# Patient Record
Sex: Male | Born: 1955 | Race: White | Hispanic: No | Smoking: Never smoker
Health system: Southern US, Community
[De-identification: ages and names within clinical notes are randomized; demographics above are authoritative.]

## PROBLEM LIST (undated history)

## (undated) DIAGNOSIS — I10 Essential (primary) hypertension: Secondary | ICD-10-CM

## (undated) DIAGNOSIS — E079 Disorder of thyroid, unspecified: Secondary | ICD-10-CM

---

## 2018-03-18 ENCOUNTER — Emergency Department (HOSPITAL_COMMUNITY): Payer: Worker's Compensation

## 2018-03-18 ENCOUNTER — Emergency Department (HOSPITAL_COMMUNITY)
Admission: EM | Admit: 2018-03-18 | Discharge: 2018-03-19 | Disposition: A | Discharge status: Home / Self Care | Payer: Worker's Compensation | Attending: Emergency Medicine | Admitting: Emergency Medicine

## 2018-03-18 ENCOUNTER — Encounter (HOSPITAL_COMMUNITY): Payer: Self-pay

## 2018-03-18 DIAGNOSIS — Y9389 Activity, other specified: Secondary | ICD-10-CM | POA: Diagnosis not present

## 2018-03-18 DIAGNOSIS — Z79899 Other long term (current) drug therapy: Secondary | ICD-10-CM | POA: Insufficient documentation

## 2018-03-18 DIAGNOSIS — S299XXA Unspecified injury of thorax, initial encounter: Secondary | ICD-10-CM | POA: Diagnosis present

## 2018-03-18 DIAGNOSIS — M25852 Other specified joint disorders, left hip: Secondary | ICD-10-CM | POA: Insufficient documentation

## 2018-03-18 DIAGNOSIS — S2242XA Multiple fractures of ribs, left side, initial encounter for closed fracture: Secondary | ICD-10-CM | POA: Diagnosis not present

## 2018-03-18 DIAGNOSIS — R52 Pain, unspecified: Secondary | ICD-10-CM

## 2018-03-18 DIAGNOSIS — Y929 Unspecified place or not applicable: Secondary | ICD-10-CM | POA: Insufficient documentation

## 2018-03-18 DIAGNOSIS — I10 Essential (primary) hypertension: Secondary | ICD-10-CM | POA: Diagnosis not present

## 2018-03-18 DIAGNOSIS — Y999 Unspecified external cause status: Secondary | ICD-10-CM | POA: Diagnosis not present

## 2018-03-18 DIAGNOSIS — W19XXXA Unspecified fall, initial encounter: Secondary | ICD-10-CM

## 2018-03-18 HISTORY — DX: Disorder of thyroid, unspecified: E07.9

## 2018-03-18 HISTORY — DX: Essential (primary) hypertension: I10

## 2018-03-18 MED ORDER — HYDROCODONE-ACETAMINOPHEN 5-325 MG PO TABS
1.0000 | ORAL_TABLET | Freq: Once | ORAL | Status: AC
  Administered 2018-03-18: 1 via ORAL
  Filled 2018-03-18: qty 1

## 2018-03-18 MED ORDER — HYDROCODONE-ACETAMINOPHEN 5-325 MG PO TABS
1.0000 | ORAL_TABLET | ORAL | 0 refills | Status: AC | PRN
Start: 2018-03-18 — End: ?

## 2018-03-18 NOTE — Discharge Instructions (Signed)
If you develop worsening pain or if you develop shortness of breath or any other new or concerning symptoms then return to the ER for evaluation.  Otherwise follow-up with your primary care doctor.  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

## 2018-03-18 NOTE — ED Notes (Signed)
Bed: [facility] Expected date:  Expected time:  Means of arrival:  Comments: Fall 5 feet

## 2018-03-18 NOTE — ED Provider Notes (Signed)
[facility]-EMERGENCY DEPT Provider Note   CSN: [id number] Arrival date & time: 03/18/18  2034     History   Chief Complaint No chief complaint on file.   HPI Barry Chapman is a 62 y.o. male.  HPI  62 year old male presents after a fall.  He was on a flat bed truck about 5 feet above the ground and his foot got caught and he slipped and fell and landed on the concrete.  He landed on his left side.  He is having most of the pain in his left scapula.  He is also having left leg pain.  He was unable to get up on his own but when friends lifted him up he was able to bear weight on the left side.  He did not hit his head or lose consciousness.  He denies headache, neck pain, chest pain, shortness of breath or abdominal pain.  Pain is moderate.  He has not taken anything for the pain.  This occurred about 30 minutes prior to arrival.  Past Medical History:  Diagnosis Date  . Hypertension   . Thyroid disease     There are no active problems to display for this patient.   History reviewed. No pertinent surgical history.      Home Medications    Prior to Admission medications   Medication Sig Start Date End Date Taking? Authorizing Provider  escitalopram (LEXAPRO) 10 MG tablet Take 10 mg by mouth daily.   Yes [provider]  levothyroxine (SYNTHROID, LEVOTHROID) 175 MCG tablet Take 175 mcg by mouth daily before breakfast.   Yes [provider]  losartan-hydrochlorothiazide (HYZAAR) 100-12.5 MG tablet Take 1 tablet by mouth daily.   Yes [provider]  HYDROcodone-acetaminophen (NORCO) 5-325 MG tablet Take 1-2 tablets by mouth every 4 (four) hours as needed for severe pain. 03/18/18   Pricilla Loveless, MD    Family History History reviewed. No pertinent family history.  Social History Social History   Tobacco Use  . Smoking status: Never Smoker  . Smokeless tobacco: Never Used  Substance Use Topics  . Alcohol use: Never   Frequency: Never  . Drug use: Never     Allergies   Penicillins and Sulfa antibiotics   Review of Systems Review of Systems  Respiratory: Negative for shortness of breath.   Cardiovascular: Negative for chest pain.  Gastrointestinal: Negative for abdominal pain and vomiting.  Musculoskeletal: Positive for arthralgias.  Neurological: Negative for weakness, numbness and headaches.  All other systems reviewed and are negative.    Physical Exam Updated Vital Signs BP 124/79 (BP Location: Left Arm)   Pulse 63   Temp 98.6 F (37 C) (Oral)   SpO2 96%   Physical Exam  Constitutional: He is oriented to person, place, and time. He appears well-developed and well-nourished. No distress.  HENT:  Head: Normocephalic and atraumatic.  Right Ear: External ear normal.  Left Ear: External ear normal.  Nose: Nose normal.  Eyes: Right eye exhibits no discharge. Left eye exhibits no discharge.  Neck: Normal range of motion. Neck supple.  Cardiovascular: Normal rate, regular rhythm and normal heart sounds.  Pulses:      Radial pulses are 2+ on the left side.       Dorsalis pedis pulses are 2+ on the left side.  Pulmonary/Chest: Effort normal and breath sounds normal.  Abdominal: Soft. He exhibits no distension. There is no tenderness.  Musculoskeletal: He exhibits no edema.  Left shoulder: He exhibits normal range of motion.       Left hip: He exhibits tenderness (lateral). He exhibits normal range of motion.       Left knee: He exhibits normal range of motion. No tenderness found.       Cervical back: He exhibits tenderness.       Thoracic back: He exhibits bony tenderness.       Lumbar back: He exhibits no tenderness.       Back:       Left upper leg: He exhibits tenderness (lateral).       Left lower leg: He exhibits tenderness (lateral).  Neurological: He is alert and oriented to person, place, and time.  Skin: Skin is warm and dry. He is not diaphoretic.  Nursing note and  vitals reviewed.    ED Treatments / Results  Labs (all labs ordered are listed, but only abnormal results are displayed) Labs Reviewed - No data to display  EKG None  Radiology Dg Chest 1 View  Result Date: 03/18/2018 CLINICAL DATA:  Left scapular pain after fall from truck EXAM: CHEST  1 VIEW COMPARISON:  Left scapular radiographs from earlier on the same day. FINDINGS: Heart size is top-normal. No mediastinal widening. No aortic aneurysm. No pneumothorax or pulmonary consolidation. Minimally displaced left fifth and sixth rib fractures are demonstrated. The fourth rib fracture best seen on the scapular radiographs is occult on these chest radiographs. No additional osseous abnormality. IMPRESSION: Borderline cardiomegaly without active pulmonary disease. Only the left fifth and sixth rib fractures are seen on these chest radiographs. The left fourth rib fracture seen on the scapular radiographs is occult. No pneumothorax or pulmonary consolidation. Electronically Signed   By: Tollie Eth M.D.   On: 03/18/2018 22:33   Dg Thoracic Spine W/swimmers  Result Date: 03/18/2018 CLINICAL DATA:  Pain after fall from truck. EXAM: THORACIC SPINE - 3 VIEWS COMPARISON:  Left scapular radiographs from the same day. FINDINGS: There is no evidence of thoracic spine fracture. Alignment is normal. Posterior left fifth rib fracture is included on this study. The fourth and sixth rib fractures are not included. IMPRESSION: Negative for acute thoracic spine fracture. Of the known left-sided rib fracture seen on the scapular radiographs, only the fifth rib fracture is included on this study. Electronically Signed   By: Tollie Eth M.D.   On: 03/18/2018 22:30   Dg Pelvis 1-2 Views  Result Date: 03/18/2018 CLINICAL DATA:  Larey Seat off a truck.  Pain. EXAM: PELVIS - 1-2 VIEW COMPARISON:  None. FINDINGS: There is no evidence of pelvic fracture or diastasis. No pelvic bone lesions are seen. IMPRESSION: Negative.  Electronically Signed   By: Elsie Stain M.D.   On: 03/18/2018 22:22   Dg Scapula Left  Result Date: 03/18/2018 CLINICAL DATA:  Pain after fall from truck EXAM: LEFT SCAPULA - 2+ VIEWS COMPARISON:  None. FINDINGS: Acute left fourth, fifth and sixth rib fractures with age-indeterminate eighth rib fracture posteriorly. Slight displacement noted of the fifth and sixth ribs, more so of the sixth rib. No pneumothorax or effusion. The scapula appears intact. No malalignment at the [facility] nor glenohumeral joints. IMPRESSION: 1. Acute left fourth through sixth rib fractures with slight displacement of the fifth and sixth ribs. Cortical irregularity without definite fracture lucency of the posterior eighth rib may represent an age-indeterminate fracture. 2. Intact scapula. Electronically Signed   By: Tollie Eth M.D.   On: 03/18/2018 22:28   Dg Tibia/fibula Left  Result Date: 03/18/2018 CLINICAL DATA:  [place]are[place] Seat off a truck.  Pain. EXAM: LEFT TIBIA AND FIBULA - 2 VIEW COMPARISON:  None. FINDINGS: There is no evidence of fracture or other focal bone lesions. Soft tissues are unremarkable. IMPRESSION: Negative. Electronically Signed   By: Elsie Stain M.D.   On: 03/18/2018 22:23   Dg Femur Min 2 Views Left  Result Date: 03/18/2018 CLINICAL DATA:  Patient fell 5 feet from a truck at work. Patient fell onto left scapula and presents with pain as well as difficulty straightening left leg. EXAM: LEFT FEMUR 2 VIEWS COMPARISON:  None. FINDINGS: Aspherical appearance of the left femoral head compatible with femoroacetabular impingement morphology. Slight protrusio of the acetabulum. No femoral fracture. No joint dislocation. Intact pubic symphysis, left SI joint and left-sided pubic rami. Soft tissues are largely unremarkable. Spurring is seen off the upper and lower pole of the patella as well as tibial tuberosity. No joint effusion of the knee. IMPRESSION: 1. Negative for acute fracture or malalignment of the left femur. 2.  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 Electronically Signed   By: Tollie Eth M.D.   On: 03/18/2018 22:25    Procedures Procedures (including critical care time)  Medications Ordered in ED Medications  HYDROcodone-acetaminophen (NORCO/VICODIN) 5-325 MG per tablet 1 tablet (1 tablet Oral Given 03/18/18 2132)     Initial Impression / Assessment and Plan / ED Course  I have reviewed the triage vital signs and the nursing notes.  Pertinent labs & imaging results that were available during my care of the patient were reviewed by me and considered in my medical decision making (see chart for details).     Patient's x-ray shows 3 rib fractures.  No pneumothorax or hemothorax.  Lungs are clear and no hypoxia.  Pain is better after hydrocodone.  He is able to get up and ambulate and bear weight on his left leg.  His x-ray does show femoral acetabular impingement and he will be referred to orthopedics.  Otherwise, this was a mechanical fall.  He will be treated with pain medicine and encouraged also use Tylenol, ice, NSAIDs in addition to the hydrocodone for breakthrough pain.  Given incentive spirometer.  Return precautions.  Final Clinical Impressions(s) / ED Diagnoses   Final diagnoses:  Fall, initial encounter  Closed fracture of multiple ribs of left side, initial encounter  Femoroacetabular impingement of left hip    ED Discharge Orders        Ordered    HYDROcodone-acetaminophen (NORCO) 5-325 MG tablet  Every 4 hours PRN     03/18/18 2310       Pricilla Loveless, MD 03/18/18 2328

## 2018-03-18 NOTE — ED Triage Notes (Signed)
Pt was at work and fell about 5 feet onto concrete from a truck, he complains of left scapula and left leg pain, he didn't hit his head and he's not on blood thinners Pt has abrasions on his scapula and on the inner left leg

## 2018-03-18 NOTE — ED Notes (Signed)
Pt didn't not report any LOC or syncope

## 2019-06-29 IMAGING — CR DG TIBIA/FIBULA 2V*L*
4 series · 4 of 4 positions shown · non-contrast
Comparison: None.

CLINICAL DATA: Fell off a truck.  Pain.

EXAM:
LEFT TIBIA AND FIBULA - 2 VIEW

[x tib-fib ap left (1 of 2)]
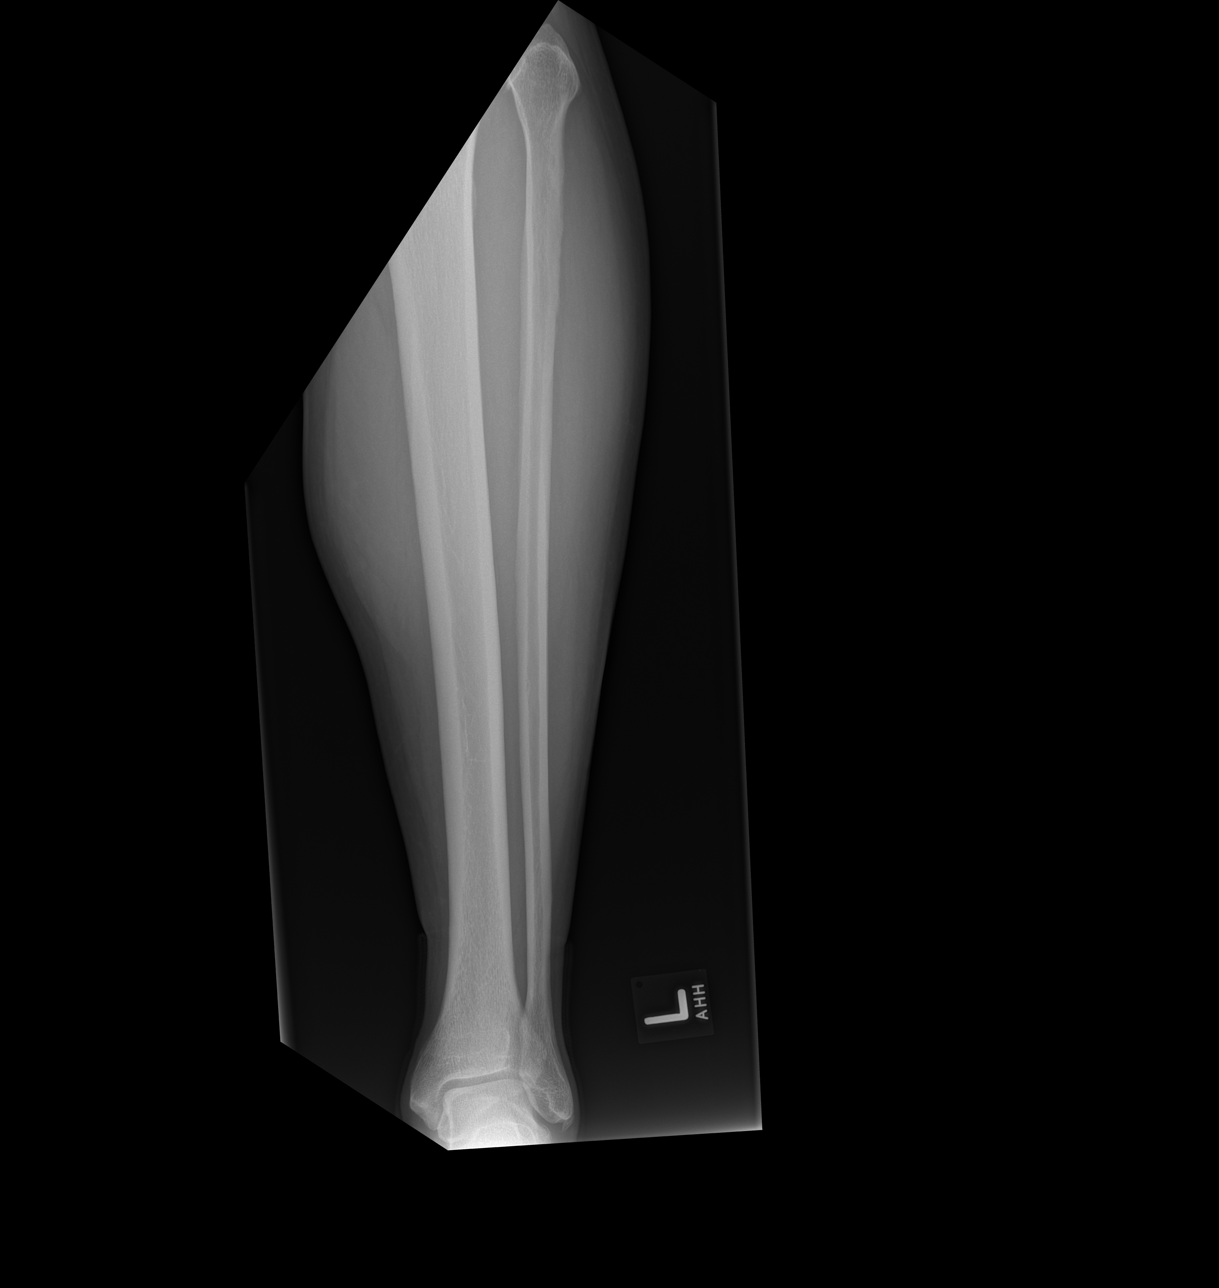

[x tib-fib ap left (2 of 2)]
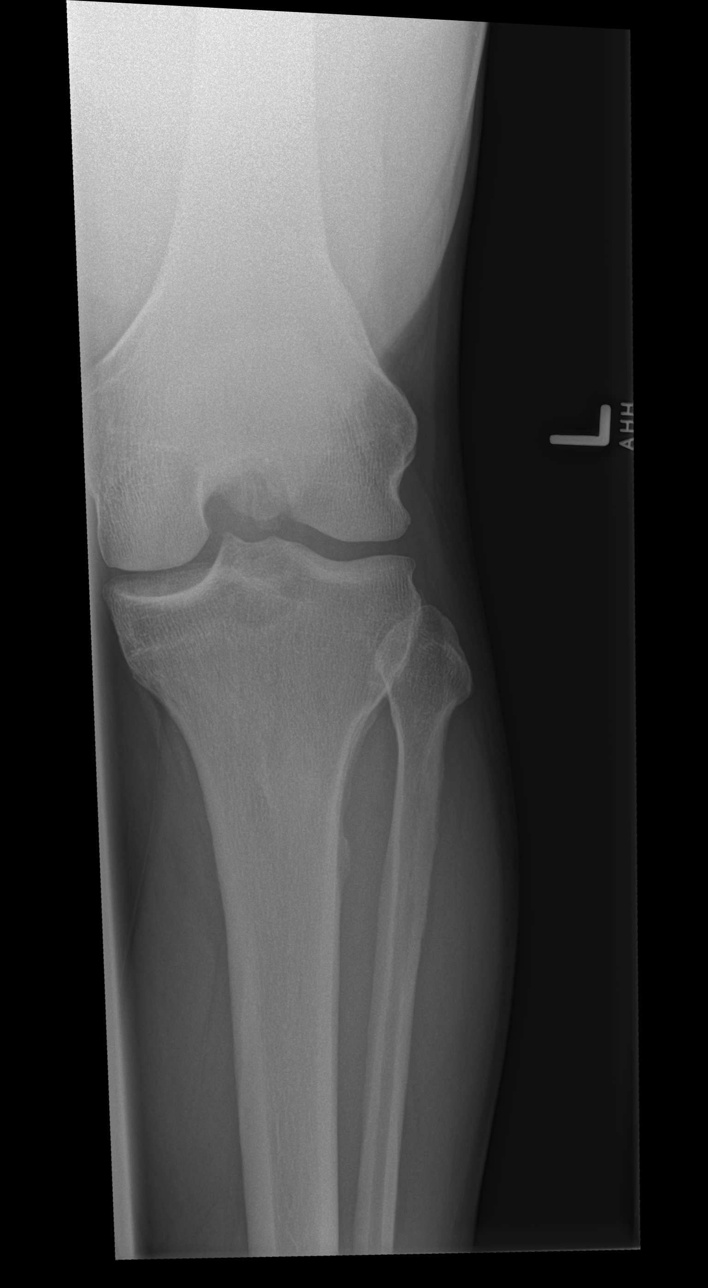

[x tib-fib lat left (1 of 2)]
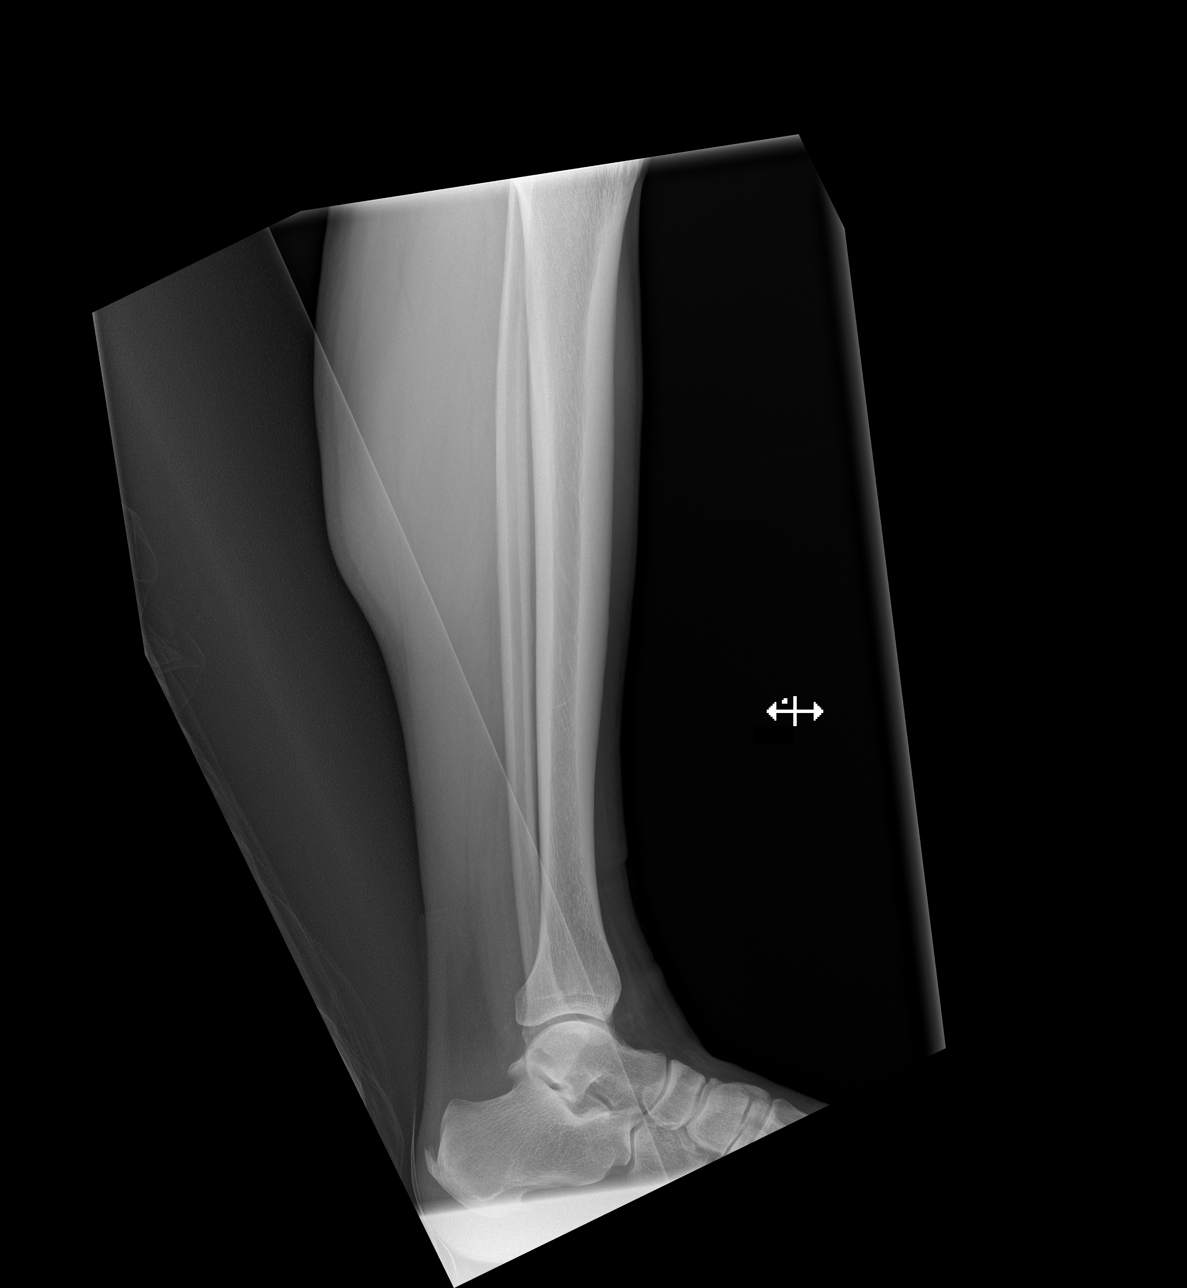

[x tib-fib lat left (2 of 2)]
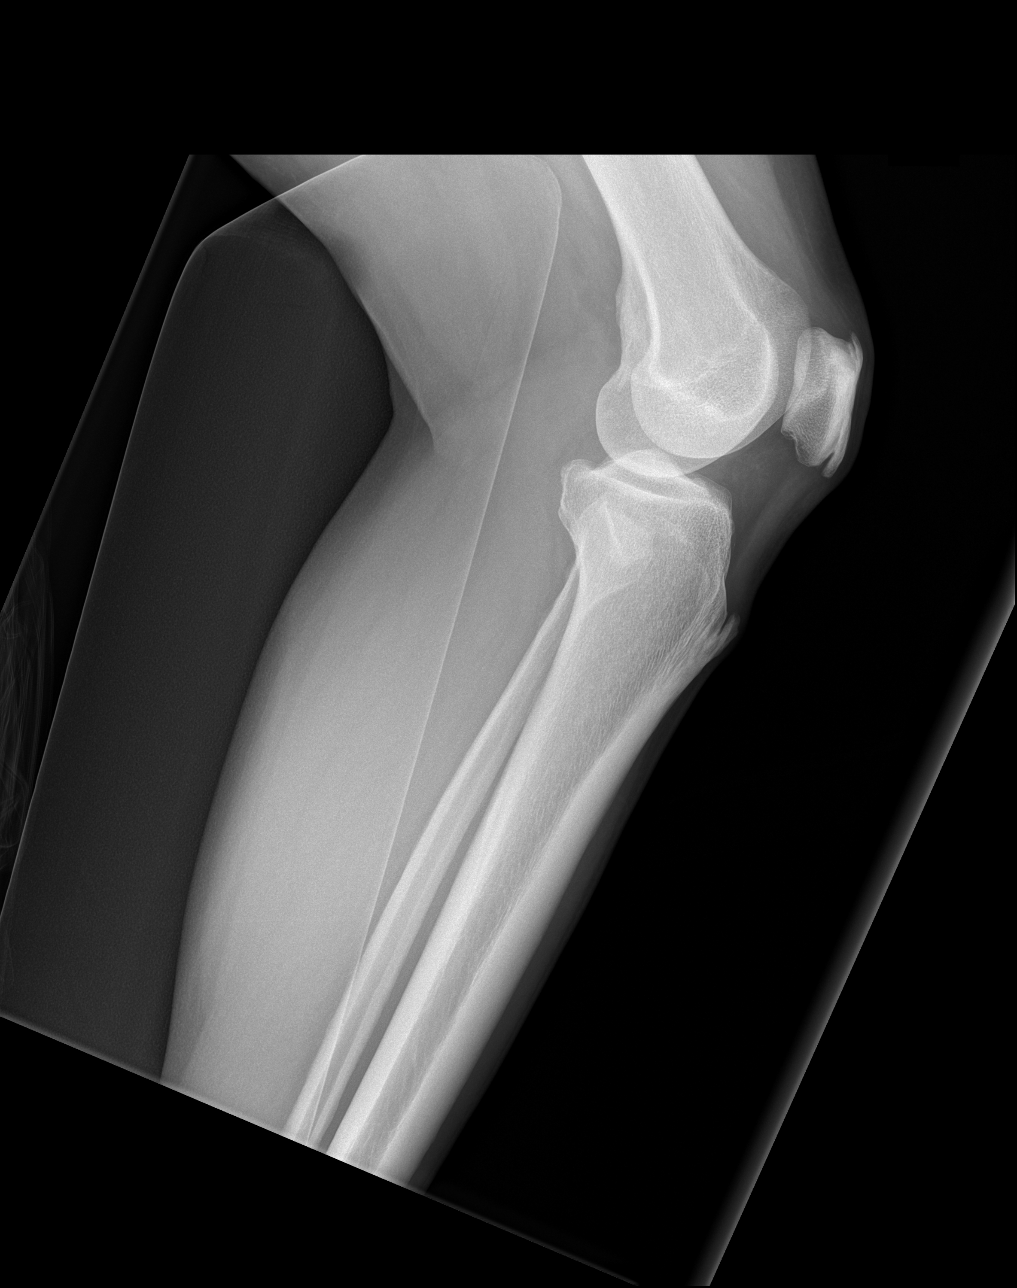

[4 of 4 positions shown; findings below may reference images not displayed]

FINDINGS: There is no evidence of fracture or other focal bone lesions. Soft
tissues are unremarkable.
IMPRESSION: Negative.
